# Patient Record
Sex: Male | Born: 1955 | Race: White | Hispanic: No | Marital: Single | Smoking: Former smoker
Health system: Southern US, Community
[De-identification: ages and names within clinical notes are randomized; demographics above are authoritative.]

## PROBLEM LIST (undated history)

## (undated) DIAGNOSIS — J45909 Unspecified asthma, uncomplicated: Secondary | ICD-10-CM

## (undated) DIAGNOSIS — I10 Essential (primary) hypertension: Secondary | ICD-10-CM

## (undated) DIAGNOSIS — M542 Cervicalgia: Secondary | ICD-10-CM

## (undated) DIAGNOSIS — E7800 Pure hypercholesterolemia, unspecified: Secondary | ICD-10-CM

## (undated) DIAGNOSIS — G8929 Other chronic pain: Secondary | ICD-10-CM

## (undated) DIAGNOSIS — E119 Type 2 diabetes mellitus without complications: Secondary | ICD-10-CM

## (undated) HISTORY — PX: APPENDECTOMY: SHX54

## (undated) HISTORY — PX: VASECTOMY: SHX75

---

## 2004-02-09 HISTORY — PX: CARDIAC CATHETERIZATION: SHX172

## 2004-09-23 ENCOUNTER — Inpatient Hospital Stay (HOSPITAL_COMMUNITY): Admission: EM | Admit: 2004-09-23 | Discharge: 2004-09-24 | Payer: Self-pay | Admitting: *Deleted

## 2004-09-23 ENCOUNTER — Ambulatory Visit: Payer: Self-pay | Admitting: *Deleted

## 2004-09-24 ENCOUNTER — Ambulatory Visit: Payer: Self-pay | Admitting: Internal Medicine

## 2006-03-08 ENCOUNTER — Emergency Department (HOSPITAL_COMMUNITY): Admission: EM | Admit: 2006-03-08 | Discharge: 2006-03-08 | Payer: Self-pay | Admitting: Emergency Medicine

## 2013-08-13 ENCOUNTER — Emergency Department (HOSPITAL_COMMUNITY)
Admission: EM | Admit: 2013-08-13 | Discharge: 2013-08-13 | Disposition: A | Discharge status: Home / Self Care | Attending: Emergency Medicine | Admitting: Emergency Medicine

## 2013-08-13 ENCOUNTER — Encounter (HOSPITAL_COMMUNITY): Payer: Self-pay | Admitting: Emergency Medicine

## 2013-08-13 ENCOUNTER — Emergency Department (HOSPITAL_COMMUNITY)

## 2013-08-13 DIAGNOSIS — Z87891 Personal history of nicotine dependence: Secondary | ICD-10-CM | POA: Diagnosis not present

## 2013-08-13 DIAGNOSIS — IMO0002 Reserved for concepts with insufficient information to code with codable children: Secondary | ICD-10-CM | POA: Diagnosis not present

## 2013-08-13 DIAGNOSIS — J45909 Unspecified asthma, uncomplicated: Secondary | ICD-10-CM | POA: Insufficient documentation

## 2013-08-13 DIAGNOSIS — I10 Essential (primary) hypertension: Secondary | ICD-10-CM | POA: Diagnosis not present

## 2013-08-13 DIAGNOSIS — Z7982 Long term (current) use of aspirin: Secondary | ICD-10-CM | POA: Diagnosis not present

## 2013-08-13 DIAGNOSIS — E7800 Pure hypercholesterolemia, unspecified: Secondary | ICD-10-CM | POA: Diagnosis not present

## 2013-08-13 DIAGNOSIS — G8929 Other chronic pain: Secondary | ICD-10-CM | POA: Diagnosis not present

## 2013-08-13 DIAGNOSIS — E785 Hyperlipidemia, unspecified: Secondary | ICD-10-CM | POA: Diagnosis not present

## 2013-08-13 DIAGNOSIS — Z79899 Other long term (current) drug therapy: Secondary | ICD-10-CM | POA: Diagnosis not present

## 2013-08-13 DIAGNOSIS — R079 Chest pain, unspecified: Secondary | ICD-10-CM | POA: Insufficient documentation

## 2013-08-13 DIAGNOSIS — I517 Cardiomegaly: Secondary | ICD-10-CM | POA: Diagnosis not present

## 2013-08-13 HISTORY — DX: Cervicalgia: M54.2

## 2013-08-13 HISTORY — DX: Unspecified asthma, uncomplicated: J45.909

## 2013-08-13 HISTORY — DX: Essential (primary) hypertension: I10

## 2013-08-13 HISTORY — DX: Other chronic pain: G89.29

## 2013-08-13 HISTORY — DX: Pure hypercholesterolemia, unspecified: E78.00

## 2013-08-13 LAB — TROPONIN I: Troponin I: <0.3 ng/mL (ref <0.30)

## 2013-08-13 LAB — HEPATIC FUNCTION PANEL
ALK PHOS: 70 U/L (ref 39–117)
ALT: 11 U/L (ref 0–53)
AST: 17 U/L (ref 0–37)
Albumin: 3.9 g/dL (ref 3.5–5.2)
Bilirubin, Direct: <0.2 mg/dL (ref 0.0–0.3)
TOTAL PROTEIN: 7 g/dL (ref 6.0–8.3)
Total Bilirubin: 0.3 mg/dL (ref 0.3–1.2)

## 2013-08-13 LAB — CBC WITH DIFFERENTIAL/PLATELET
Basophils Absolute: 0 10*3/uL (ref 0.0–0.1)
Basophils Relative: 1 % (ref 0–1)
Eosinophils Absolute: 0.1 10*3/uL (ref 0.0–0.7)
Eosinophils Relative: 1 % (ref 0–5)
HCT: 41.7 % (ref 39.0–52.0)
HEMOGLOBIN: 14.2 g/dL (ref 13.0–17.0)
LYMPHS ABS: 1.4 10*3/uL (ref 0.7–4.0)
LYMPHS PCT: 20 % (ref 12–46)
MCH: 29.9 pg (ref 26.0–34.0)
MCHC: 34.1 g/dL (ref 30.0–36.0)
MCV: 87.8 fL (ref 78.0–100.0)
MONO ABS: 0.5 10*3/uL (ref 0.1–1.0)
Monocytes Relative: 8 % (ref 3–12)
NEUTROS PCT: 70 % (ref 43–77)
Neutro Abs: 4.9 10*3/uL (ref 1.7–7.7)
PLATELETS: 229 10*3/uL (ref 150–400)
RBC: 4.75 MIL/uL (ref 4.22–5.81)
RDW: 13 % (ref 11.5–15.5)
WBC: 6.9 10*3/uL (ref 4.0–10.5)

## 2013-08-13 LAB — BASIC METABOLIC PANEL
ANION GAP: 12 (ref 5–15)
BUN: 11 mg/dL (ref 6–23)
CALCIUM: 9.6 mg/dL (ref 8.4–10.5)
CO2: 24 mEq/L (ref 19–32)
CREATININE: 0.62 mg/dL (ref 0.50–1.35)
Chloride: 103 mEq/L (ref 96–112)
GFR calc Af Amer: >90 mL/min (ref >90)
Glucose, Bld: 128 mg/dL — ABNORMAL HIGH (ref 70–99)
POTASSIUM: 4.5 meq/L (ref 3.7–5.3)
Sodium: 139 mEq/L (ref 137–147)

## 2013-08-13 LAB — D-DIMER, QUANTITATIVE: D-Dimer, Quant: <0.27 ug/mL-FEU (ref 0.00–0.48)

## 2013-08-13 MED ORDER — ASPIRIN 325 MG PO TABS: 325.0000 mg | ORAL_TABLET | Freq: Once | ORAL | Status: DC

## 2013-08-13 MED ORDER — ACETAMINOPHEN 500 MG PO TABS
1000.0000 mg | ORAL_TABLET | Freq: Once | ORAL | Status: AC
  Administered 2013-08-13: 1000 mg via ORAL
  Filled 2013-08-13: qty 2

## 2013-08-13 NOTE — ED Provider Notes (Signed)
CSN: [id number]     Arrival date & time 08/13/13  0904 History  This chart was scribed for Brian HutchingBrian Maraya Gwilliam, MD by Ardelia Memsylan Malpass, ED Scribe. This patient was seen in room APA10/APA10 and the patient's care was started at 9:30 AM.   Chief Complaint  Patient presents with  . Chest Pain    The history is provided by the patient. No language interpreter was used.    HPI Comments: Nilsa NuttingWesley O Morris is a 58 y.o. male with a history of HTN and hypercholesterolemia brought by EMS and correctional officers to the Emergency Department complaining of intermittent, moderate chest pain that radiates to his back onset at 11 PM last night. He describes his pain as tightness. Pt states that he was given a Nitroglycerin patch, 3 sprays of Nitroglycerin and 324 mg Aspirin PTA with some relief of his pain. Pt states that he has had similar pain in the past. He states that he has a history of cardiac catheterization in 2007. He states that he takes 81 mg Aspirin daily. Pt is from a [place]al [place] and he is accompanied by Camera operatorcorrectional officers. Pt is a former smoker who quit in 2009. He states that he chews tobacco.  Pt reports a family history of his brother having a massive MI at age 60.     Past Medical History  Diagnosis Date  . Hypertension   . Hypercholesterolemia   . Asthma   . Chronic neck pain    Past Surgical History  Procedure Laterality Date  . Appendectomy    . Vasectomy    . Cardiac catheterization  2006    Normal Coronaries   Family History  Problem Relation Age of Onset  . Heart attack Brother   . Cancer Sister   . Cancer Mother   . Cancer Father    History  Substance Use Topics  . Smoking status: Former Smoker    Quit date: 02/27/2013  . Smokeless tobacco: Former NeurosurgeonUser    Types: Chew  . Alcohol Use: No    Review of Systems A complete 10 system review of systems was obtained and all systems are negative except as noted in the HPI and PMH.   Allergies  Codeine and  Iodine  Home Medications   Prior to Admission medications   Medication Sig Start Date End Date Taking? Authorizing Provider  acetaminophen (TYLENOL) 500 MG tablet Take 1,000 mg by mouth every 6 (six) hours as needed for moderate pain.   Yes Historical Provider, MD  albuterol (PROVENTIL HFA;VENTOLIN HFA) 108 (90 BASE) MCG/ACT inhaler Inhale 1-2 puffs into the lungs every 6 (six) hours as needed for wheezing or shortness of breath.   Yes Historical Provider, MD  aspirin EC 81 MG tablet Take 81 mg by mouth daily.   Yes Historical Provider, MD  beclomethasone (QVAR) 40 MCG/ACT inhaler Inhale 1 puff into the lungs 2 (two) times daily.   Yes Historical Provider, MD  benazepril (LOTENSIN) 20 MG tablet Take 20 mg by mouth daily.   Yes Historical Provider, MD  lovastatin (MEVACOR) 40 MG tablet Take 40 mg by mouth at bedtime.   Yes Historical Provider, MD   Triage Vitals: BP 127/80  Pulse 94  Temp(Src) 98.1 F (36.7 C) (Oral)  Resp 20  Ht 6\' 1"  (1.854 m)  Wt 260 lb (117.935 kg)  BMI 34.31 kg/m2  SpO2 98%  Physical Exam  Nursing note and vitals reviewed. Constitutional: He is oriented to person, place, and time. He  appears well-developed and well-nourished.  HENT:  Head: Normocephalic and atraumatic.  Eyes: Conjunctivae and EOM are normal. Pupils are equal, round, and reactive to light.  Neck: Normal range of motion. Neck supple.  Cardiovascular: Normal rate, regular rhythm and normal heart sounds.   Pulmonary/Chest: Effort normal and breath sounds normal.  Abdominal: Soft. Bowel sounds are normal.  Musculoskeletal: Normal range of motion.  Neurological: He is alert and oriented to person, place, and time.  Skin: Skin is warm and dry.  Psychiatric: He has a normal mood and affect. His behavior is normal.    ED Course  Procedures (including critical care time)  DIAGNOSTIC STUDIES: Oxygen Saturation is 98% on RA, normal by my interpretation.    COORDINATION OF CARE: 9:37 AM-  Discussed plan to obtain EKG, CXR and blood work. Discussed plan for possible admission. Pt advised of plan for treatment and pt agrees.  Results for orders placed during the hospital encounter of 08/13/13  CBC WITH DIFFERENTIAL      Result Value Ref Range   WBC 6.9  4.0 - 10.5 K/uL   RBC 4.75  4.22 - 5.81 MIL/uL   Hemoglobin 14.2  13.0 - 17.0 g/dL   HCT 16.141.7  09.639.0 - 04.552.0 %   MCV 87.8  78.0 - 100.0 fL   MCH 29.9  26.0 - 34.0 pg   MCHC 34.1  30.0 - 36.0 g/dL   RDW 40.913.0  81.111.5 - 91.415.5 %   Platelets 229  150 - 400 K/uL   Neutrophils Relative % 70  43 - 77 %   Neutro Abs 4.9  1.7 - 7.7 K/uL   Lymphocytes Relative 20  12 - 46 %   Lymphs Abs 1.4  0.7 - 4.0 K/uL   Monocytes Relative 8  3 - 12 %   Monocytes Absolute 0.5  0.1 - 1.0 K/uL   Eosinophils Relative 1  0 - 5 %   Eosinophils Absolute 0.1  0.0 - 0.7 K/uL   Basophils Relative 1  0 - 1 %   Basophils Absolute 0.0  0.0 - 0.1 K/uL  BASIC METABOLIC PANEL      Result Value Ref Range   Sodium 139  137 - 147 mEq/L   Potassium 4.5  3.7 - 5.3 mEq/L   Chloride 103  96 - 112 mEq/L   CO2 24  19 - 32 mEq/L   Glucose, Bld 128 (*) 70 - 99 mg/dL   BUN 11  6 - 23 mg/dL   Creatinine, Ser 7.820.62  0.50 - 1.35 mg/dL   Calcium 9.6  8.4 - 95.610.5 mg/dL   GFR calc non Af Amer >90  >90 mL/min   GFR calc Af Amer >90  >90 mL/min   Anion gap 12  5 - 15  TROPONIN I      Result Value Ref Range   Troponin I <0.30  <0.30 ng/mL  HEPATIC FUNCTION PANEL      Result Value Ref Range   Total Protein 7.0  6.0 - 8.3 g/dL   Albumin 3.9  3.5 - 5.2 g/dL   AST 17  0 - 37 U/L   ALT 11  0 - 53 U/L   Alkaline Phosphatase 70  39 - 117 U/L   Total Bilirubin 0.3  0.3 - 1.2 mg/dL   Bilirubin, Direct <2.1<0.2  0.0 - 0.3 mg/dL   Indirect Bilirubin NOT CALCULATED  0.3 - 0.9 mg/dL  D-DIMER, QUANTITATIVE      Result Value Ref Range  D-Dimer, Quant <0.27  0.00 - 0.48 ug/mL-FEU  TROPONIN I      Result Value Ref Range   Troponin I <0.30  <0.30 ng/mL   Dg Chest Portable 1  View  08/13/2013   CLINICAL DATA:  Chest pain.  EXAM: PORTABLE CHEST - 1 VIEW  COMPARISON:  03/08/2006.  FINDINGS: The cardiac silhouette, mediastinal and hilar contours are within normal limits and stable. There is tortuosity, ectasia and calcification of the thoracic aorta. The lungs are clear. No pleural effusion. The bony thorax is intact.  IMPRESSION: No acute cardiopulmonary findings.   Electronically Signed   By: Loralie Champagne M.D.   On: 08/13/2013 09:29     EKG Interpretation   Date/Time:  Monday August 13 2013 09:12:19 EDT Ventricular Rate:  89 PR Interval:  206 QRS Duration: 86 QT Interval:  345 QTC Calculation: 420 R Axis:   50 Text Interpretation:  Sinus rhythm Borderline prolonged PR interval Low  voltage, precordial leads Confirmed by Kevyn Boquet  MD, Jasimine Simms ([id number]) on 08/13/2013  9:27:10 AM      MDM   Final diagnoses:  Chest pain, unspecified chest pain type  Essential hypertension  Hyperlipidemia  History of tobacco use    Chest pain with multiple risk factors. EKG, troponin, d-dimer negative. Cardiology consult obtained. Echocardiogram suggests no acute event. Will followup with [facility] cardiology.   I personally performed the services described in this documentation, which was scribed in my presence. The recorded information has been reviewed and is accurate.   Brian Hutching, MD 08/14/13 [phone]

## 2013-08-13 NOTE — ED Notes (Signed)
EMS reports pt c/o pain in chest radiating through to back since last night.  EMS administered 324mg  asa, 3 sprays of nitro, and 1 inch of nitro paste.  EMS says pain went from 5 to 3.

## 2013-08-13 NOTE — ED Notes (Signed)
ECHO was called  About consult.  Spoke with Weyerhaeuser CompanyCindy.  Nurse informed.

## 2013-08-13 NOTE — Progress Notes (Signed)
  Echocardiogram 2D Echocardiogram has been performed.  Jb Dulworth 08/13/2013, 2:43 PM

## 2013-08-13 NOTE — Consult Note (Signed)
CARDIOLOGY CONSULT NOTE   Patient ID: Brian Morris MRN: [id number] DOB/AGE: 60/06/1955 60 y.o.  Admit Date: 08/13/2013 Referring Physician: Donnetta Hutchingook,Brian, MD (ER) Primary Physician: No PCP Per Patient Consulting Cardiologist: Prentice DockerKoneswaran, Suresh MD Primary Cardiologist: Formerly Dr. Dorethea ClanHardin Reason for Consultation: Chest Pain  Clinical Summary Mr. Brian Morris is a 58 y.o.male inmate of correctional facility, with history of hypertension, asthma, hyperlipidemia, former tobacco abuse (quit 2001), chronic cervical spine pain, cardiac cath in 09/2004 normal coronary arteries, seen in ER with complaints of chest discomfort. Symptoms began last evening while lying in bed, pressure over the left chest, unrelenting, waxing and waning but never fully going away. He felt a sharp pain in his back radiating into his midsternal area, lasting several seconds. Chest pressure awoke him several times throughout the night, but no associated dyspnea, dizziness, NV D. He reported this to the [place] the [place]al facility and was brought to ER.   On arrival to ER, BP 127/80, HR 94,  O2 sat 98%, Troponin <0.30. CXR demonstrating no acute cardiopulmonary findings.  EKG demonstrated NSR. No acute changes indicative of ACS.  He was treated with NTG and pain subsided.   Allergies  Allergen Reactions  . Codeine   . Iodine     Medications Scheduled Medications: (Home Medications) Tylenol prn Albuterol inhaler 1-2 puffs Q 6 hrs prn ASA 81 mg daily Qvar 40 mcg/ACTinhaler 1 puff BID Benazepril 20 mg daily Lovastatin 40 mg at HS        Past Medical History  Diagnosis Date  . Hypertension   . Hypercholesterolemia   . Asthma   . Chronic neck pain     Past Surgical History  Procedure Laterality Date  . Appendectomy    . Vasectomy    . Cardiac catheterization  2006    Normal Coronaries    Family History  Problem Relation Age of Onset  . Heart attack Brother   . Cancer Sister   .  Cancer Mother   . Cancer Father     Social History Mr. Brian Morris reports that he quit smoking about 5 months ago. He has quit using smokeless tobacco. His smokeless tobacco use included Chew. Mr. Brian Morris reports that he does not drink alcohol.  Review of Systems Otherwise reviewed and negative except as outlined.  Physical Examination Blood pressure 127/80, pulse 94, temperature 98.1 F (36.7 C), temperature source Oral, resp. rate 20, height 6\' 1"  (1.854 m), weight 260 lb (117.935 kg), SpO2 98.00%. No intake or output data in the 24 hours ending 08/13/13 1155  Telemetry: NSR  GEN: Resting on stretcher, no acute distress HEENT: Conjunctiva and lids normal, oropharynx clear with moist mucosa. Neck: Supple, no elevated JVP or carotid bruits, no thyromegaly. Lungs: Bibasilar crackles Cardiac: Regular rate and rhythm, no S3 or significant systolic murmur, no pericardial rub. Abdomen: Soft, nontender, no hepatomegaly, bowel sounds present, mild tenderness RUQ but not true Murphy's sign Extremities: No pitting edema, distal pulses 2+. Skin: Warm and dry. Musculoskeletal: No kyphosis. Neuropsychiatric: Alert and oriented x3, affect grossly appropriate.  Prior Cardiac Testing/Procedures 1.Cardac Cath 2009 ([facility]) Normal coronaries. Normal LV fx  Lab Results  Basic Metabolic Panel:  Recent Labs Lab 08/13/13 0928  NA 139  K 4.5  CL 103  CO2 24  GLUCOSE 128*  BUN 11  CREATININE 0.62  CALCIUM 9.6   CBC:  Recent Labs Lab 08/13/13 0928  WBC 6.9  NEUTROABS 4.9  HGB 14.2  HCT 41.7  MCV 87.8  PLT  229    Cardiac Enzymes:  Recent Labs Lab 08/13/13 0928  TROPONINI <0.30    Radiology: Dg Chest Portable 1 View  08/13/2013   CLINICAL DATA:  Chest pain.  EXAM: PORTABLE CHEST - 1 VIEW  COMPARISON:  03/08/2006.  FINDINGS: The cardiac silhouette, mediastinal and hilar contours are within normal limits and stable. There is tortuosity, ectasia and calcification of the  thoracic aorta. The lungs are clear. No pleural effusion. The bony thorax is intact.  IMPRESSION: No acute cardiopulmonary findings.   Electronically Signed   By: Loralie ChampagneMark  Gallerani M.D.   On: 08/13/2013 09:29     ECG: NSR rate of 89 bpm.   Impression and Recommendations  1.Atypical Chest Pain: Described as pressure over the left side of his chest, constant, with waxing and waning, and one episode of sharp pain from back to mid-sternal chest which lasted several seconds. Chest pressure awoke him overnight on more than one occasion. No associated dyspnea or NV or diaphoresis. NTG helped with the pain, and he is currently pain free.  EKG is normal, along with troponin X 1.   He has risk factors for CAD, to include FH, former smoker, hyperlipidemia, hypertension, and family hx of prematrue CAD.Cardiac cath in 2006 negative for CAD.  Can consider stress test. This can be completed as OP. Will check D-Dimer.   2. Hypertension: Currently controlled. Would continue ACE. Creatinine 0.62. Echo for LV fx. Cath in 2006 normal LV fx.   3. Asthma: Continue to use inhalers. Some crackles noted in the bases, but no active wheezing. He is not on BB.   4. Hypercholesterolemia: Continue statin use.   5. Abdominal discomfort:  Tenderness over RUQ but no true Murphy's sign. Consider ultrasound of GB. Check LFT's.   Signed: Bettey MareKathryn M. Zamaria Brazzle NP  08/13/2013, 11:55 AM Co-Sign MD

## 2013-08-13 NOTE — ED Notes (Signed)
MD at bedside. Cardiology 

## 2013-08-13 NOTE — Discharge Instructions (Signed)
Tests were all normal.  Cardiologist will call for followup appointment.

## 2013-08-13 NOTE — Consult Note (Addendum)
The patient was seen and examined, and I agree with the assessment and plan as documented above. Pt presented with waxing and waning left precordial pain which has since resolved. He had been awoken by it. He tried mowing the lawn but this did not alleviate the pain, nor made it any worse. Denies orthopnea, palpitations, leg swelling, and paroxysmal nocturnal dyspnea. Has had normal ECG, chest xray, and troponin. Hemodynamically stable. Has cardiovascular risk factors which include prior tobacco chewing (quit in 2009), HTN, and hyperlipidemia.  RECS: Would recommend checking one more troponin and a d-dimer. Will obtain an echocardiogram for further clarification. If aforementioned testing is unremarkable, could discharge from ED with arrangement for outpatient stress testing. Discussed plan with Dr. Donnetta HutchingBrian Cook.

## 2013-08-14 ENCOUNTER — Telehealth: Payer: Self-pay

## 2013-08-14 NOTE — Telephone Encounter (Signed)
Requested to schedule cardiac testing and fu here in cardiology.[facility] [place] [facility] [place] [place] [facility] and I spoke with [place] Ms[place].She [facility].She asked me to fax her all info on patients and she would give to her physician for review.

## 2013-08-16 ENCOUNTER — Telehealth: Payer: Self-pay

## 2013-08-16 DIAGNOSIS — R079 Chest pain, unspecified: Secondary | ICD-10-CM

## 2013-08-16 NOTE — Telephone Encounter (Signed)
Message copied by Nori RiisARLTON, Devita Nies A on Thu Aug 16, 2013 12:59 PM ------      Message from: Prentice DockerKONESWARAN, SURESH A      Created: Mon Aug 13, 2013  4:24 PM      Regarding: f/u        Please have him f/u with me in 4-6 weeks, and arrange for a treadmill stress echocardiogram beforehand. Thanks.            Darliss RidgelSuresh ------

## 2013-08-16 NOTE — Telephone Encounter (Signed)
I spoke with a Ms.Price [facility] [place] [facility].Dr.Sampson Harrell reviewed chart and did not order any testing for pt

## 2013-08-23 ENCOUNTER — Other Ambulatory Visit (HOSPITAL_COMMUNITY)

## 2013-10-09 ENCOUNTER — Ambulatory Visit: Admitting: Cardiovascular Disease

## 2018-04-16 ENCOUNTER — Emergency Department (HOSPITAL_COMMUNITY): Payer: BLUE CROSS/BLUE SHIELD

## 2018-04-16 ENCOUNTER — Emergency Department (HOSPITAL_COMMUNITY)
Admission: EM | Admit: 2018-04-16 | Discharge: 2018-04-16 | Disposition: A | Discharge status: Home / Self Care | Payer: BLUE CROSS/BLUE SHIELD | Attending: Emergency Medicine | Admitting: Emergency Medicine

## 2018-04-16 ENCOUNTER — Encounter (HOSPITAL_COMMUNITY): Payer: Self-pay | Admitting: *Deleted

## 2018-04-16 ENCOUNTER — Other Ambulatory Visit: Payer: Self-pay

## 2018-04-16 DIAGNOSIS — J45909 Unspecified asthma, uncomplicated: Secondary | ICD-10-CM | POA: Diagnosis not present

## 2018-04-16 DIAGNOSIS — Z87891 Personal history of nicotine dependence: Secondary | ICD-10-CM | POA: Insufficient documentation

## 2018-04-16 DIAGNOSIS — Z79899 Other long term (current) drug therapy: Secondary | ICD-10-CM | POA: Insufficient documentation

## 2018-04-16 DIAGNOSIS — R002 Palpitations: Secondary | ICD-10-CM | POA: Diagnosis present

## 2018-04-16 DIAGNOSIS — I10 Essential (primary) hypertension: Secondary | ICD-10-CM | POA: Insufficient documentation

## 2018-04-16 DIAGNOSIS — I471 Supraventricular tachycardia: Secondary | ICD-10-CM | POA: Diagnosis not present

## 2018-04-16 DIAGNOSIS — Z7984 Long term (current) use of oral hypoglycemic drugs: Secondary | ICD-10-CM | POA: Insufficient documentation

## 2018-04-16 DIAGNOSIS — E119 Type 2 diabetes mellitus without complications: Secondary | ICD-10-CM | POA: Diagnosis not present

## 2018-04-16 HISTORY — DX: Type 2 diabetes mellitus without complications: E11.9

## 2018-04-16 LAB — I-STAT TROPONIN, ED: Troponin i, poc: 0.01 ng/mL (ref 0.00–0.08)

## 2018-04-16 LAB — CBC WITH DIFFERENTIAL/PLATELET
Abs Immature Granulocytes: 0.02 10*3/uL (ref 0.00–0.07)
Basophils Absolute: 0 10*3/uL (ref 0.0–0.1)
Basophils Relative: 0 %
Eosinophils Absolute: 0 10*3/uL (ref 0.0–0.5)
Eosinophils Relative: 1 %
HCT: 41.6 % (ref 39.0–52.0)
Hemoglobin: 13.1 g/dL (ref 13.0–17.0)
Immature Granulocytes: 0 %
Lymphocytes Relative: 8 %
Lymphs Abs: 0.7 10*3/uL (ref 0.7–4.0)
MCH: 28.5 pg (ref 26.0–34.0)
MCHC: 31.5 g/dL (ref 30.0–36.0)
MCV: 90.4 fL (ref 80.0–100.0)
MONOS PCT: 8 %
Monocytes Absolute: 0.6 10*3/uL (ref 0.1–1.0)
Neutro Abs: 6.7 10*3/uL (ref 1.7–7.7)
Neutrophils Relative %: 83 %
Platelets: 229 10*3/uL (ref 150–400)
RBC: 4.6 MIL/uL (ref 4.22–5.81)
RDW: 12.6 % (ref 11.5–15.5)
WBC: 8.1 10*3/uL (ref 4.0–10.5)
nRBC: 0 % (ref 0.0–0.2)

## 2018-04-16 LAB — BASIC METABOLIC PANEL
Anion gap: 9 (ref 5–15)
BUN: 23 mg/dL (ref 8–23)
CALCIUM: 9.2 mg/dL (ref 8.9–10.3)
CO2: 24 mmol/L (ref 22–32)
Chloride: 102 mmol/L (ref 98–111)
Creatinine, Ser: 0.91 mg/dL (ref 0.61–1.24)
GFR calc Af Amer: >60 mL/min (ref >60)
GFR calc non Af Amer: >60 mL/min (ref >60)
Glucose, Bld: 212 mg/dL — ABNORMAL HIGH (ref 70–99)
POTASSIUM: 3.5 mmol/L (ref 3.5–5.1)
Sodium: 135 mmol/L (ref 135–145)

## 2018-04-16 MED ORDER — DILTIAZEM HCL 25 MG/5ML IV SOLN
10.0000 mg | Freq: Once | INTRAVENOUS | Status: AC
  Administered 2018-04-16: 10 mg via INTRAVENOUS
  Filled 2018-04-16: qty 5

## 2018-04-16 MED ORDER — METOPROLOL TARTRATE 5 MG/5ML IV SOLN
5.0000 mg | INTRAVENOUS | Status: AC | PRN
  Administered 2018-04-16 (×3): 5 mg via INTRAVENOUS
  Filled 2018-04-16 (×3): qty 5

## 2018-04-16 MED ORDER — SODIUM CHLORIDE 0.9 % IV BOLUS
500.0000 mL | Freq: Once | INTRAVENOUS | Status: AC
  Administered 2018-04-16: 500 mL via INTRAVENOUS

## 2018-04-16 MED ORDER — IOHEXOL 350 MG/ML SOLN
100.0000 mL | Freq: Once | INTRAVENOUS | Status: AC | PRN
  Administered 2018-04-16: 100 mL via INTRAVENOUS

## 2018-04-16 NOTE — ED Triage Notes (Addendum)
Pt arrived to er by [facility] with c/o eleavted hr, ems reports that pt's hr was 150 upon their arrival, hr did decrease to 130's by arrival to er, pt alert, able to answer all questions, denies any pain, does admit to dizziness with standing, pt reports having diarrhea three days ago,

## 2018-04-16 NOTE — ED Notes (Signed)
Patient transported to CT 

## 2018-04-16 NOTE — ED Notes (Signed)
ED Provider at bedside. 

## 2018-04-16 NOTE — ED Provider Notes (Signed)
[facility] EMERGENCY DEPARTMENT Provider Note   CSN: [id number] Arrival date & time: 04/16/18  9147    History   Chief Complaint Chief Complaint  Patient presents with  . Tachycardia    HPI Brian Morris is a 63 y.o. male.     Patient presents to the emergency department for evaluation of heart palpitations.  Patient reports that he woke up from sleep tonight and was not feeling well.  He felt like he could hear his heartbeat in his ears and noticed that his heart was racing.  Symptoms were not present when he went to bed tonight.  He has never had similar symptoms.  He is not experiencing any chest pain.  He has felt some dizziness tonight, mostly with standing.  He had diarrhea several days ago but that resolved.  No nausea or vomiting.     Past Medical History:  Diagnosis Date  . Asthma   . Chronic neck pain   . Diabetes mellitus without complication (HCC)   . Hypercholesterolemia   . Hypertension     There are no active problems to display for this patient.   Past Surgical History:  Procedure Laterality Date  . APPENDECTOMY    . CARDIAC CATHETERIZATION  2006   Normal Coronaries  . VASECTOMY          Home Medications    Prior to Admission medications   Medication Sig Start Date End Date Taking? Authorizing Provider  acetaminophen (TYLENOL) 500 MG tablet Take 1,000 mg by mouth every 6 (six) hours as needed for moderate pain.   Yes [provider]  albuterol (PROVENTIL HFA;VENTOLIN HFA) 108 (90 BASE) MCG/ACT inhaler Inhale 1-2 puffs into the lungs every 6 (six) hours as needed for wheezing or shortness of breath.   Yes [provider]  aspirin EC 81 MG tablet Take 81 mg by mouth daily.   Yes [provider]  atenolol (TENORMIN) 50 MG tablet Take 50 mg by mouth 2 (two) times daily.   Yes [provider]  hydrochlorothiazide (HYDRODIURIL) 25 MG tablet Take 25 mg by mouth daily.   Yes [provider]  metFORMIN  (GLUCOPHAGE) 500 MG tablet Take by mouth daily with breakfast.   Yes [provider]  beclomethasone (QVAR) 40 MCG/ACT inhaler Inhale 1 puff into the lungs 2 (two) times daily.    [provider]  benazepril (LOTENSIN) 20 MG tablet Take 20 mg by mouth daily.    [provider]  lovastatin (MEVACOR) 40 MG tablet Take 40 mg by mouth at bedtime.    [provider]    Family History Family History  Problem Relation Age of Onset  . Heart attack Brother   . Cancer Sister   . Cancer Mother   . Cancer Father     Social History Social History   Tobacco Use  . Smoking status: Former Smoker    Last attempt to quit: 02/27/2013    Years since quitting: 5.1  . Smokeless tobacco: Former Neurosurgeon    Types: Chew  Substance Use Topics  . Alcohol use: No  . Drug use: No     Allergies   Codeine   Review of Systems Review of Systems  Cardiovascular: Positive for palpitations. Negative for chest pain.  Neurological: Positive for dizziness.  All other systems reviewed and are negative.    Physical Exam Updated Vital Signs BP (!) 141/89   Pulse (!) 113   Temp 98.9 F (37.2 C) (Oral)  Resp 13   Ht  (1.854 m)   Wt 122 kg   SpO2 95%   BMI 35.49 kg/m   Physical Exam Vitals signs and nursing note reviewed.  Constitutional:      General: He is not in acute distress.    Appearance: Normal appearance. He is well-developed.  HENT:     Head: Normocephalic and atraumatic.     Right Ear: Hearing normal.     Left Ear: Hearing normal.     Nose: Nose normal.  Eyes:     Conjunctiva/sclera: Conjunctivae normal.     Pupils: Pupils are equal, round, and reactive to light.  Neck:     Musculoskeletal: Normal range of motion and neck supple.  Cardiovascular:     Rate and Rhythm: Regular rhythm. Tachycardia present.     Heart sounds: S1 normal and S2 normal. No murmur. No friction rub. No gallop.   Pulmonary:     Effort: Pulmonary effort is normal. No  respiratory distress.     Breath sounds: Normal breath sounds.  Chest:     Chest wall: No tenderness.  Abdominal:     General: Bowel sounds are normal.     Palpations: Abdomen is soft.     Tenderness: There is no abdominal tenderness. There is no guarding or rebound. Negative signs include Murphy's sign and McBurney's sign.     Hernia: No hernia is present.  Musculoskeletal: Normal range of motion.  Skin:    General: Skin is warm and dry.     Findings: No rash.  Neurological:     Mental Status: He is alert and oriented to person, place, and time.     GCS: GCS eye subscore is 4. GCS verbal subscore is 5. GCS motor subscore is 6.     Cranial Nerves: No cranial nerve deficit.     Sensory: No sensory deficit.     Coordination: Coordination normal.  Psychiatric:        Speech: Speech normal.        Behavior: Behavior normal.        Thought Content: Thought content normal.      ED Treatments / Results  Labs (all labs ordered are listed, but only abnormal results are displayed) Labs Reviewed  BASIC METABOLIC PANEL - Abnormal; Notable for the following components:      Result Value   Glucose, Bld 212 (*)    All other components within normal limits  CBC WITH DIFFERENTIAL/PLATELET  I-STAT TROPONIN, ED    EKG EKG Interpretation  Date/Time:  Sunday April 16 2018 03:18:26 EDT Ventricular Rate:  134 PR Interval:    QRS Duration: 85 QT Interval:  345 QTC Calculation: 516 R Axis:   19 Text Interpretation:  Ectopic Atrial Tachycardia or Junctional tachycardia Prolonged QT interval Reconfirmed by Gilda Crease [phone]) on 04/16/2018 7:18:04 AM   EKG Interpretation  Date/Time:  Sunday April 16 2018 07:12:41 EDT Ventricular Rate:  82 PR Interval:    QRS Duration: 89 QT Interval:  367 QTC Calculation: 429 R Axis:   42 Text Interpretation:  Sinus rhythm Borderline low voltage, extremity leads Confirmed by Gilda Crease [phone]) on 04/16/2018 7:18:20 AM          I have personally reviewed the EKG tracing and   Radiology Ct Angio Chest Pe W Or Wo Contrast  Result Date: 04/16/2018 CLINICAL DATA:  Tachycardia and chest pain, initial encounter EXAM: CT ANGIOGRAPHY CHEST WITH CONTRAST TECHNIQUE: Multidetector CT imaging of the  chest was performed using the standard protocol during bolus administration of intravenous contrast. Multiplanar CT image reconstructions and MIPs were obtained to evaluate the vascular anatomy. CONTRAST:  OMNIPAQUE 350 COMPARISON:  08/13/2013 FINDINGS: Cardiovascular: Thoracic aorta demonstrates mild atherosclerotic calcifications without aneurysmal dilatation or dissection. No significant cardiac enlargement is noted. Coronary calcifications are seen. The pulmonary artery shows a normal branching pattern without intraluminal filling defect to suggest pulmonary embolism. Mediastinum/Nodes: Thoracic inlet is within normal limits. No hilar or mediastinal adenopathy is noted. The esophagus is within normal limits. Lungs/Pleura: Lungs are well aerated bilaterally without focal infiltrate or sizable effusion. A focal 7 mm nodule is noted in the left lower lobe best seen on image number 122 of series 7. No other sizable nodules are seen. In the left lingula laterally there is a somewhat tubular density identified with decreased attenuation. This likely represents a an area of mucous plugging. Upper Abdomen: Visualized upper abdomen is within normal limits. Musculoskeletal: Degenerative changes of the thoracic spine are noted. T6 compression deformity is noted which appears chronic in nature. Review of the MIP images confirms the above findings. IMPRESSION: No evidence of pulmonary embolism. 7 mm nodule in the left lower lobe as described. Non-contrast chest CT at 6-12 months is recommended. If the nodule is stable at time of repeat CT, then future CT at 18-24 months (from today's scan) is considered optional for low-risk patients, but is  recommended for high-risk patients. This recommendation follows the consensus statement: Guidelines for Management of Incidental Pulmonary Nodules Detected on CT Images: From the Fleischner Society 2017; Radiology 2017; 284:228-243. Tubular density within the lingula with central decreased attenuation likely representing mucous plugging. This can also be evaluated on follow-up CT as described above. Aortic Atherosclerosis (ICD10-I70.0). Electronically Signed   By: Alcide Clever M.D.   On: 04/16/2018 07:03    Procedures Procedures (including critical care time)  Medications Ordered in ED Medications  sodium chloride 0.9 % bolus 500 mL (0 mLs Intravenous Stopped 04/16/18 0429)  metoprolol tartrate (LOPRESSOR) injection 5 mg (5 mg Intravenous Given 04/16/18 0357)  diltiazem (CARDIZEM) injection 10 mg (10 mg Intravenous Given 04/16/18 0520)  diltiazem (CARDIZEM) injection 10 mg (10 mg Intravenous Given 04/16/18 0624)  iohexol (OMNIPAQUE) 350 MG/ML injection 100 mL (100 mLs Intravenous Contrast Given 04/16/18 5038)     Initial Impression / Assessment and Plan / ED Course  I have reviewed the triage vital signs and the nursing notes.  Pertinent labs & imaging results that were available during my care of the patient were reviewed by me and considered in my medical decision making (see chart for details).        Patient presents to the emergency department for evaluation of palpitations.  Patient reports that he awakened during the night and noticed that he could hear his heartbeat and felt his heart was racing.  This is never happened before.  He otherwise has no complaints.  He does not have any associated chest pain or shortness of breath.  EKG at arrival was consistent with either a junctional tachycardia or ectopic atrial tachycardia.  He appears well otherwise.  No hypotension.  Patient does take a beta-blocker regularly, was given Lopressor without effect.  Patient was then given Cardizem and  initially heart rate came down into the 110s and then he converted to a sinus rhythm.  Work-up was otherwise unremarkable.  Patient continues to do well.  He will be appropriate for discharge, outpatient follow-up with cardiology to obtain EP  studies.  Return for recurrent symptoms.  Final Clinical Impressions(s) / ED Diagnoses   Final diagnoses:  SVT (supraventricular tachycardia) [facility])    ED Discharge Orders    None       Gilda Crease, MD 04/16/18 [phone]

## 2020-09-08 IMAGING — CT CT ANGIO CHEST
2 of 6 series · 18 of 46 positions shown · IV contrast (Isovue)
Comparison: 08/13/2013

CLINICAL DATA: Tachycardia and chest pain, initial encounter

EXAM:
CT ANGIOGRAPHY CHEST WITH CONTRAST
TECHNIQUE: Multidetector CT imaging of the chest was performed using the
standard protocol during bolus administration of intravenous
contrast. Multiplanar CT image reconstructions and MIPs were
obtained to evaluate the vascular anatomy.
CONTRAST:  100mL OMNIPAQUE 350

[Series 6: thins · axial · 0.71mm/px · z∈[+1112,+1387]mm · 15 of 303 slices shown]
[im 14/303  lung]
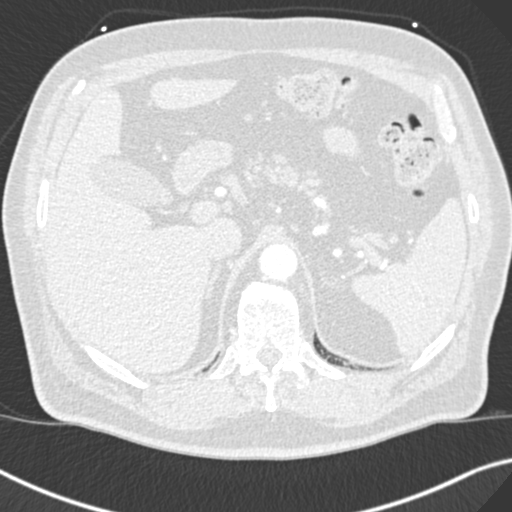
[im 40/303  soft-tissue]
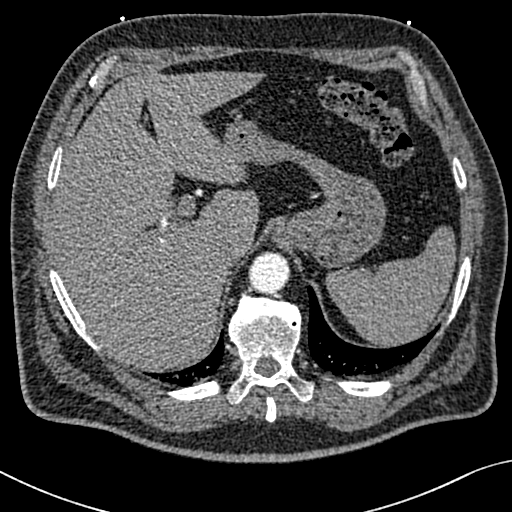
[im 53/303  lung]
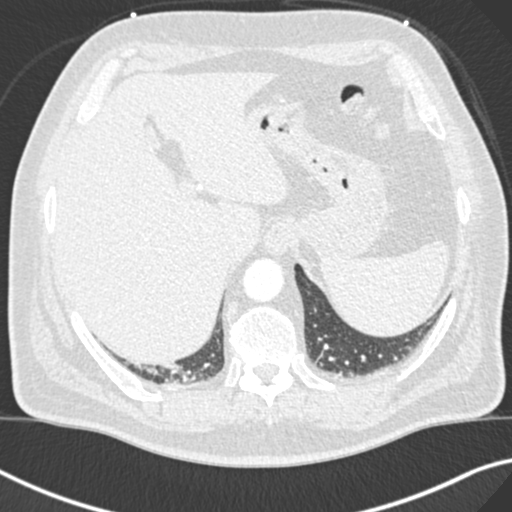
[im 79/303  soft-tissue]
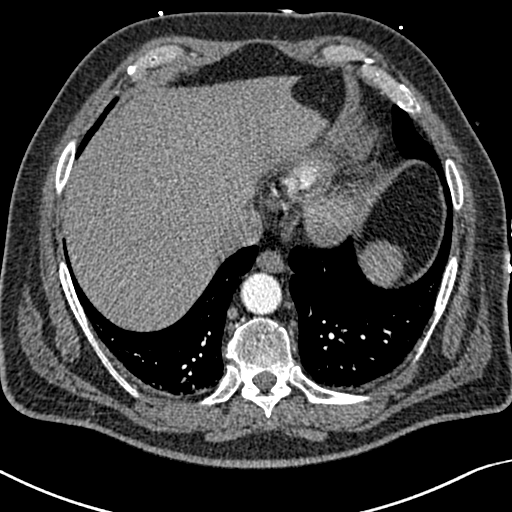
[im 92/303  lung]
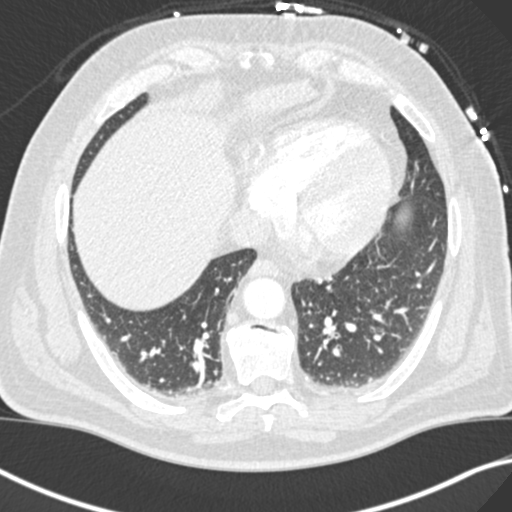
[im 119/303  soft-tissue]
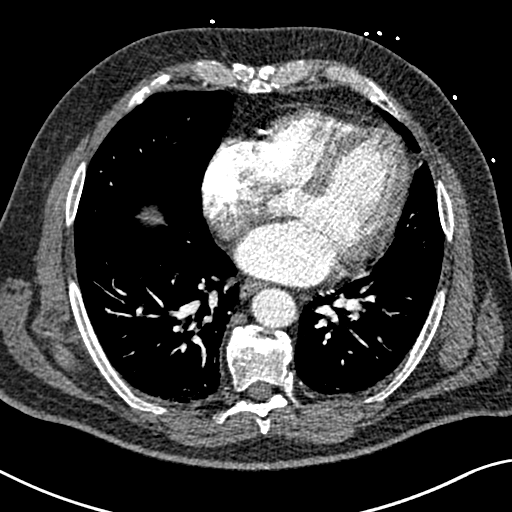
[im 132/303  lung]
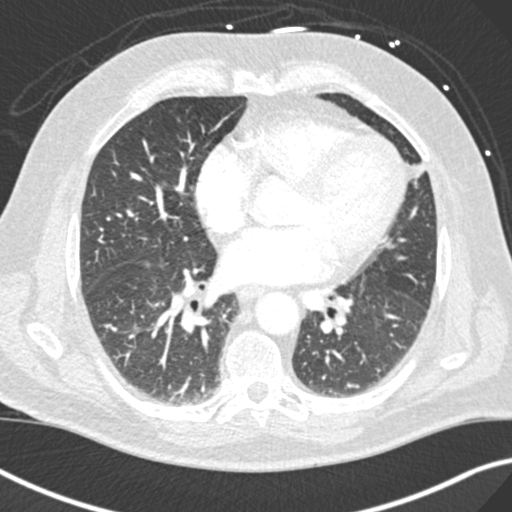
[im 158/303  soft-tissue]
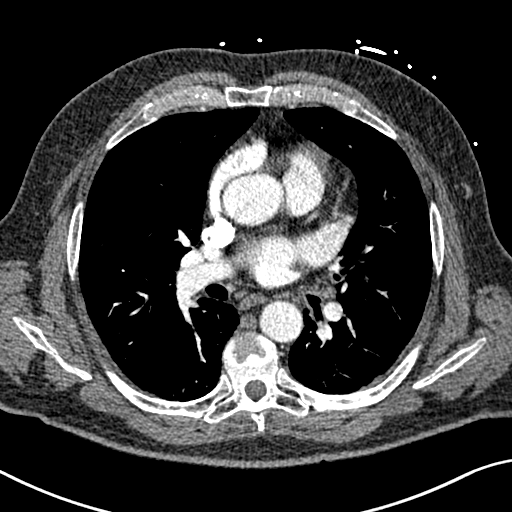
[im 171/303  lung]
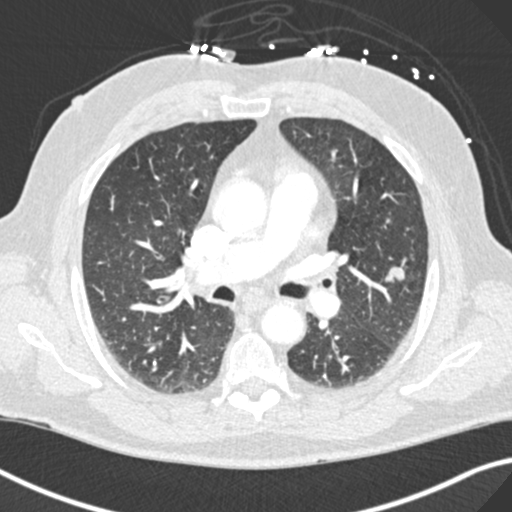
[im 184/303  soft-tissue]
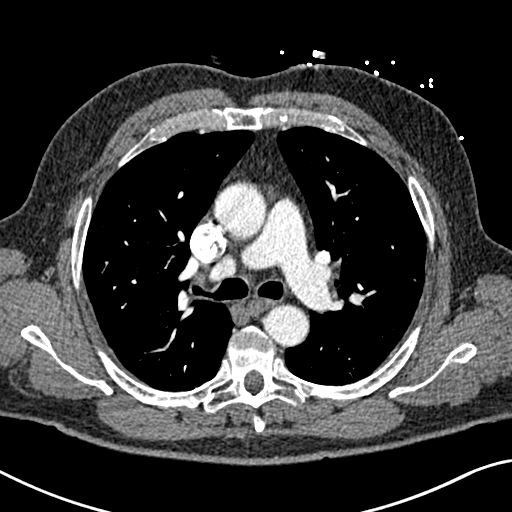
[im 211/303  lung]
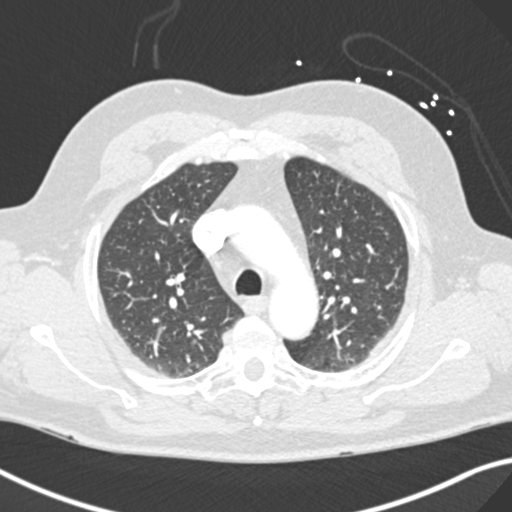
[im 224/303  soft-tissue]
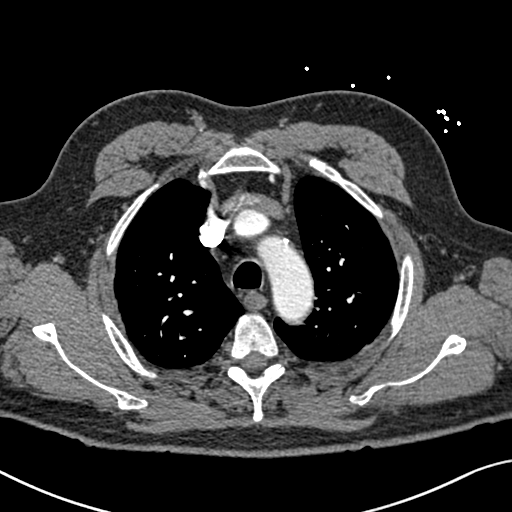
[im 250/303  lung]
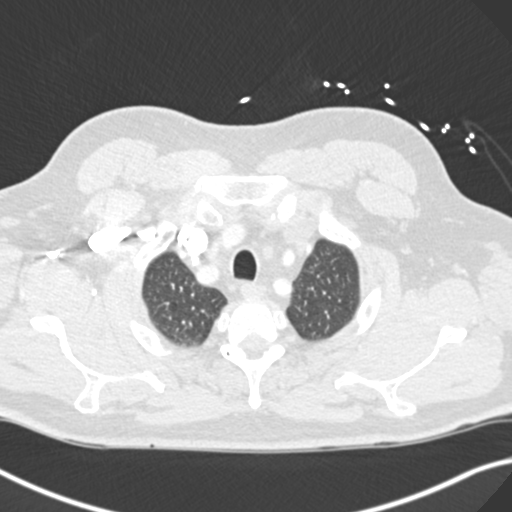
[im 263/303  soft-tissue]
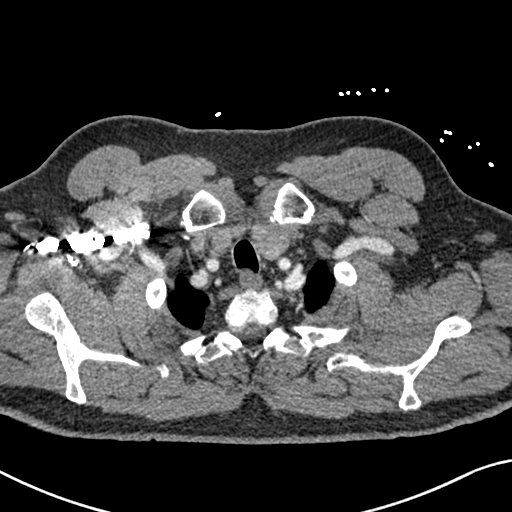
[im 289/303  lung]
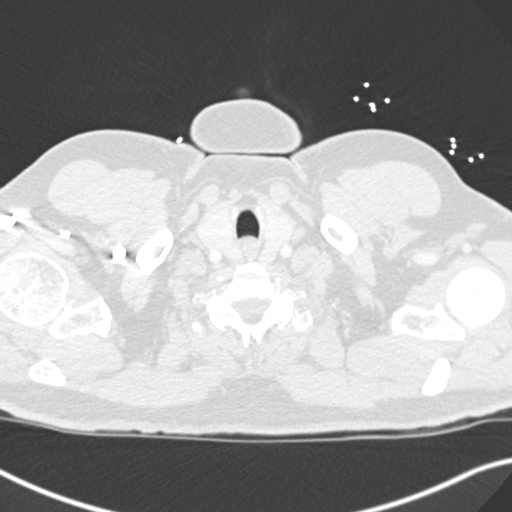

[Series 8: coronal mpr · coronal · 0.61mm/px · 3 of 170 slices shown]
[im 43/170  soft-tissue]
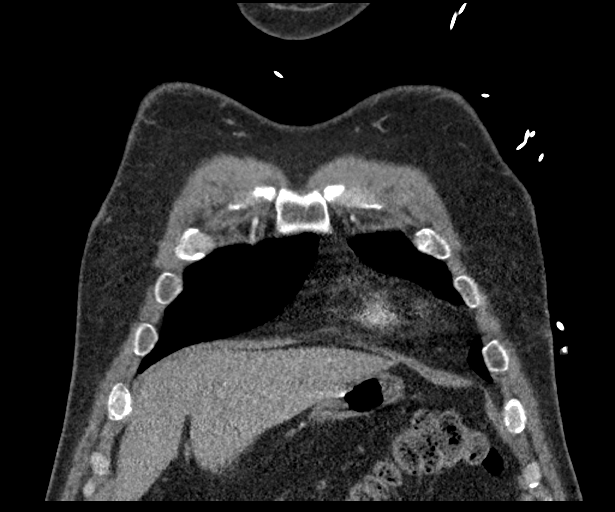
[im 85/170  soft-tissue]
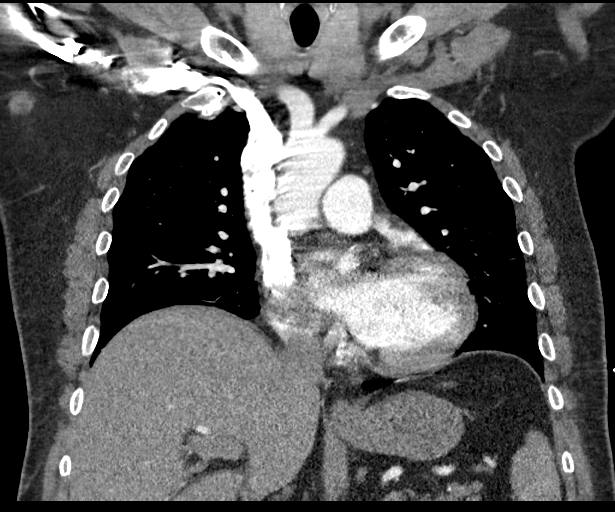
[im 127/170  soft-tissue]
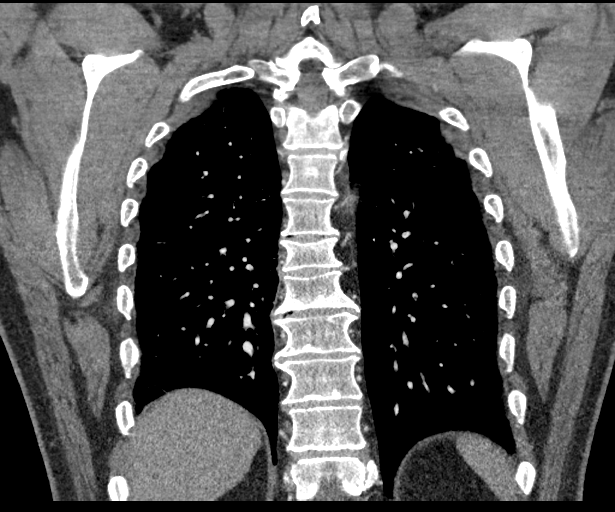

[18 of 46 positions shown; findings below may reference images not displayed]

FINDINGS: Cardiovascular: Thoracic aorta demonstrates mild atherosclerotic
calcifications without aneurysmal dilatation or dissection. No
significant cardiac enlargement is noted. Coronary calcifications
are seen. The pulmonary artery shows a normal branching pattern
without intraluminal filling defect to suggest pulmonary embolism.

Mediastinum/Nodes: Thoracic inlet is within normal limits. No hilar
or mediastinal adenopathy is noted. The esophagus is within normal
limits.

Lungs/Pleura: Lungs are well aerated bilaterally without focal
infiltrate or sizable effusion. A focal 7 mm nodule is noted in the
left lower lobe best seen on image number 122 of series 7. No other
sizable nodules are seen. In the left lingula laterally there is a
somewhat tubular density identified with decreased attenuation. This
likely represents a an area of mucous plugging.

Upper Abdomen: Visualized upper abdomen is within normal limits.

Musculoskeletal: Degenerative changes of the thoracic spine are
noted. T6 compression deformity is noted which appears chronic in
nature.

Review of the MIP images confirms the above findings.
IMPRESSION: No evidence of pulmonary embolism.

7 mm nodule in the left lower lobe as described. Non-contrast chest
CT at 6-12 months is recommended. If the nodule is stable at time of
repeat CT, then future CT at 18-24 months (from today's scan) is
considered optional for low-risk patients, but is recommended for
high-risk patients. This recommendation follows the consensus
statement: Guidelines for Management of Incidental Pulmonary Nodules
Detected on CT Images: From the [HOSPITAL] 0241; Radiology
0241; [DATE].

Tubular density within the lingula with central decreased
attenuation likely representing mucous plugging. This can also be
evaluated on follow-up CT as described above.

Aortic Atherosclerosis ([id number]-JRI.I).

## 2023-10-26 ENCOUNTER — Other Ambulatory Visit (HOSPITAL_COMMUNITY): Payer: Self-pay | Admitting: Family Medicine

## 2023-10-26 DIAGNOSIS — J984 Other disorders of lung: Secondary | ICD-10-CM

## 2023-10-26 DIAGNOSIS — R051 Acute cough: Secondary | ICD-10-CM

## 2023-11-29 ENCOUNTER — Ambulatory Visit (HOSPITAL_COMMUNITY)
Admission: RE | Admit: 2023-11-29 | Discharge: 2023-11-29 | Disposition: A | Discharge status: Home / Self Care | Payer: PRIVATE HEALTH INSURANCE | Source: Ambulatory Visit | Attending: Family Medicine | Admitting: Family Medicine

## 2023-11-29 DIAGNOSIS — J984 Other disorders of lung: Secondary | ICD-10-CM | POA: Insufficient documentation

## 2023-11-29 DIAGNOSIS — R051 Acute cough: Secondary | ICD-10-CM | POA: Diagnosis present

## 2023-11-29 LAB — POCT I-STAT CREATININE: Creatinine, Ser: 0.7 mg/dL (ref 0.61–1.24)

## 2023-11-29 MED ORDER — IOHEXOL 300 MG/ML  SOLN
100.0000 mL | Freq: Once | INTRAMUSCULAR | Status: AC | PRN
  Administered 2023-11-29: 75 mL via INTRAVENOUS
